# Patient Record
Sex: Male | Born: 1995 | Race: Black or African American | Hispanic: No | Marital: Single | Smoking: Never smoker
Health system: Southern US, Community
[De-identification: ages and names within clinical notes are randomized; demographics above are authoritative.]

---
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
## 2016-10-18 IMAGING — US US ASPIRATION
1 series · 2 of 2 positions shown · non-contrast
Comparison: None.

CLINICAL DATA: Bilateral chest wall seromas post breast surgery,
which persists after multiple aspiration procedures.

EXAM:
US DRAIN CATHETER PLACEMENT X2
TECHNIQUE: The procedure, risks (including but not limited to bleeding,
infection, organ damage ), benefits, and alternatives were explained
to the patient. Questions regarding the procedure were encouraged
and answered. The patient understands and consents to the procedure.
Survey ultrasound of the CHEST WALL was performed and bilateral
subcutaneous elongated fluid collections were identified.

[Series 1: us aspiration · 0.06mm/px · 2 of 2 slices shown]
[im 1/2]
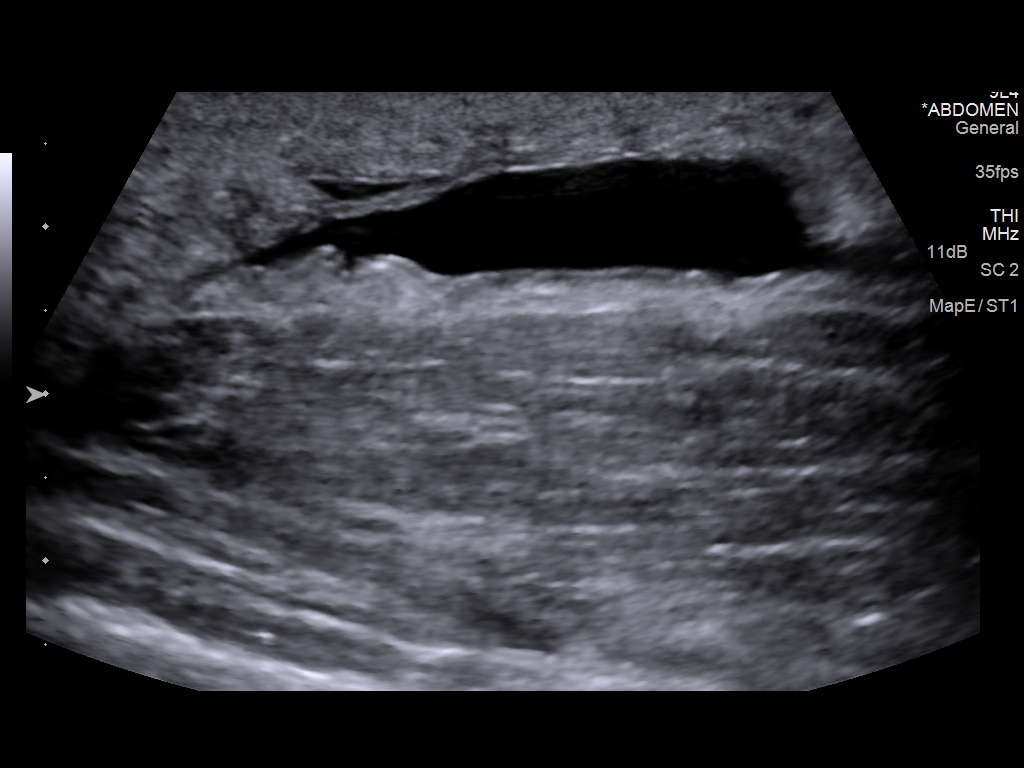
[im 2/2]
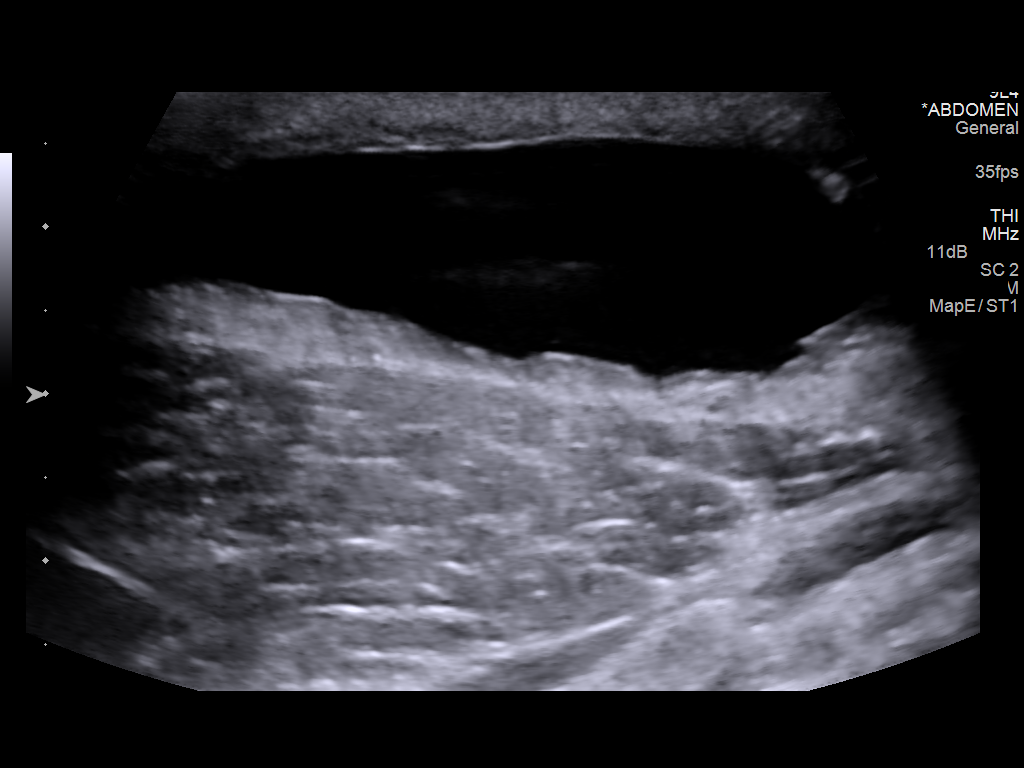

[2 of 2 positions shown; findings below may reference images not displayed]

Intravenous Fentanyl and Versed were administered as conscious
sedation during continuous cardiorespiratory monitoring by the
radiology RN, with a total moderate sedation time of 15 minutes.

Overlying skin prepped with chlorhexidine, draped in usual sterile
fashion, infiltrated locally with 1% lidocaine. An 18-gauge needle
was advanced into the right-sided collection under ultrasound
guidance. Blood-tinged thin fluid returned. An Amplatz wire advanced
easily within the collection. Tract dilated to facilitate placement
of a 10 French pigtail catheter. Catheter secured externally with 0
Prolene suture and StatLock and placed to a gravity drain bag.

In similar fashion, on the left An 18-gauge needle was advanced into
the collection under ultrasound guidance. Blood-tinged fluid
returned. An Amplatz wire advanced easily within the collection.
Tract dilated to facilitate placement of a 10 French pigtail
catheter. Catheter secured externally with 0 Prolene suture and
StatLock and placed to a gravity drain bag.

Patient tolerated procedure and was discharged home in good
condition.
IMPRESSION: 1. Technically successful right chest wall subcutaneous seroma drain
catheter placement.
2. Technically successful left chest wall subcutaneous seroma drain
catheter placement.
